# Patient Record
Sex: Female | Born: 1980 | Race: White | Hispanic: No | Marital: Married | Smoking: Never smoker
Health system: Southern US, Community
[De-identification: ages and names within clinical notes are randomized; demographics above are authoritative.]

## PROBLEM LIST (undated history)

## (undated) HISTORY — PX: CERVICAL CONE BIOPSY: SUR198

## (undated) HISTORY — PX: ANTERIOR AND POSTERIOR VAGINAL REPAIR: SUR5

---

## 2005-01-24 ENCOUNTER — Inpatient Hospital Stay: Payer: Self-pay | Admitting: Obstetrics & Gynecology

## 2007-04-07 ENCOUNTER — Ambulatory Visit: Payer: Self-pay

## 2011-12-09 ENCOUNTER — Emergency Department: Payer: Self-pay | Admitting: Unknown Physician Specialty

## 2012-06-03 ENCOUNTER — Ambulatory Visit: Payer: Self-pay

## 2018-09-29 DIAGNOSIS — L821 Other seborrheic keratosis: Secondary | ICD-10-CM | POA: Diagnosis not present

## 2018-09-29 DIAGNOSIS — Z86018 Personal history of other benign neoplasm: Secondary | ICD-10-CM | POA: Diagnosis not present

## 2018-09-29 DIAGNOSIS — L578 Other skin changes due to chronic exposure to nonionizing radiation: Secondary | ICD-10-CM | POA: Diagnosis not present

## 2018-09-29 DIAGNOSIS — D485 Neoplasm of uncertain behavior of skin: Secondary | ICD-10-CM | POA: Diagnosis not present

## 2018-10-10 ENCOUNTER — Telehealth: Payer: Self-pay | Admitting: Obstetrics & Gynecology

## 2018-10-10 NOTE — Telephone Encounter (Signed)
Patient is schedule 11/21/18 for mirena removal and insertions

## 2018-10-12 NOTE — Telephone Encounter (Signed)
Noted. Will order to arrive by apt date/time. 

## 2018-10-26 DIAGNOSIS — D2221 Melanocytic nevi of right ear and external auricular canal: Secondary | ICD-10-CM | POA: Diagnosis not present

## 2018-11-21 ENCOUNTER — Other Ambulatory Visit (HOSPITAL_COMMUNITY)
Admission: RE | Admit: 2018-11-21 | Discharge: 2018-11-21 | Disposition: A | Discharge status: Home / Self Care | Payer: BLUE CROSS/BLUE SHIELD | Source: Ambulatory Visit | Attending: Obstetrics & Gynecology | Admitting: Obstetrics & Gynecology

## 2018-11-21 ENCOUNTER — Ambulatory Visit (INDEPENDENT_AMBULATORY_CARE_PROVIDER_SITE_OTHER): Payer: BLUE CROSS/BLUE SHIELD | Admitting: Obstetrics & Gynecology

## 2018-11-21 ENCOUNTER — Encounter: Payer: Self-pay | Admitting: Obstetrics & Gynecology

## 2018-11-21 VITALS — BP 120/80 | Ht 67.0 in | Wt 157.0 lb

## 2018-11-21 DIAGNOSIS — Z01419 Encounter for gynecological examination (general) (routine) without abnormal findings: Secondary | ICD-10-CM | POA: Diagnosis not present

## 2018-11-21 DIAGNOSIS — Z30433 Encounter for removal and reinsertion of intrauterine contraceptive device: Secondary | ICD-10-CM

## 2018-11-21 DIAGNOSIS — Z124 Encounter for screening for malignant neoplasm of cervix: Secondary | ICD-10-CM | POA: Insufficient documentation

## 2018-11-21 NOTE — Progress Notes (Signed)
HPI:      [name] is a 38 y.o. [id number] who LMP was Patient's last menstrual period was 10/29/2018., she presents today for her annual examination. The patient has no complaints today; she still has exercise induced incontinence for which she uses a pessary for at times.  She has Mirena w reg cycles but improved compared to no IUD. The patient is sexually active. Her last pap: approximate date 2017 and was normal. The patient does perform self breast exams.  There is no notable family history of breast or ovarian cancer in her family.  The patient has regular exercise: yes.  The patient denies current symptoms of depression.    GYN History: Contraception: IUD  PMHx: History reviewed. No pertinent past medical history. History reviewed. No pertinent surgical history. Family History  Problem Relation Age of Onset  . Diabetes Paternal Grandmother    Social History   Tobacco Use  . Smoking status: Never Smoker  . Smokeless tobacco: Never Used  Substance Use Topics  . Alcohol use: Never    Frequency: Never  . Drug use: Never    Current Outpatient Medications:  .  levonorgestrel (MIRENA) 20 MCG/24HR IUD, 1 each by Intrauterine route once., Disp: , Rfl:  Allergies: Patient has no known allergies.  Review of Systems  Constitutional: Negative for chills, fever and malaise/fatigue.  HENT: Negative for congestion, sinus pain and sore throat.   Eyes: Negative for blurred vision and pain.  Respiratory: Negative for cough and wheezing.   Cardiovascular: Negative for chest pain and leg swelling.  Gastrointestinal: Negative for abdominal pain, constipation, diarrhea, heartburn, nausea and vomiting.  Genitourinary: Negative for dysuria, frequency, hematuria and urgency.  Musculoskeletal: Negative for back pain, joint pain, myalgias and neck pain.  Skin: Negative for itching and rash.  Neurological: Negative for dizziness, tremors and weakness.  Endo/Heme/Allergies: Does not  bruise/bleed easily.  Psychiatric/Behavioral: Negative for depression. The patient is not nervous/anxious and does not have insomnia.   All other systems reviewed and are negative.  Objective: BP 120/80   Ht 5\' 7"  (1.702 m)   Wt 157 lb (71.2 kg)   LMP 10/29/2018   BMI 24.59 kg/m   Filed Weights   11/21/18 0808  Weight: 157 lb (71.2 kg)   Body mass index is 24.59 kg/m. Physical Exam Constitutional:      General: She is not in acute distress.    Appearance: She is well-developed.  Genitourinary:     Pelvic exam was performed with patient supine.     Vagina, uterus and rectum normal.     No lesions in the vagina.     No vaginal bleeding.     No cervical motion tenderness, friability, lesion or polyp.     IUD strings visualized.     Uterus is mobile.     Uterus is not enlarged.     No uterine mass detected.    Uterus is anteverted.     No right or left adnexal mass present.     Right adnexa not tender.     Left adnexa not tender.  HENT:     Head: Normocephalic and atraumatic. No laceration.     Right Ear: Hearing normal.     Left Ear: Hearing normal.     Mouth/Throat:     Pharynx: Uvula midline.  Eyes:     Pupils: Pupils are equal, round, and reactive to light.  Neck:     Musculoskeletal: Normal range of motion and  neck supple.     Thyroid: No thyromegaly.  Cardiovascular:     Rate and Rhythm: Normal rate and regular rhythm.     Heart sounds: No murmur. No friction rub. No gallop.   Pulmonary:     Effort: Pulmonary effort is normal. No respiratory distress.     Breath sounds: Normal breath sounds. No wheezing.  Chest:     Breasts:        Right: No mass, skin change or tenderness.        Left: No mass, skin change or tenderness.  Abdominal:     General: Bowel sounds are normal. There is no distension.     Palpations: Abdomen is soft.     Tenderness: There is no abdominal tenderness. There is no rebound.  Musculoskeletal: Normal range of motion.  Neurological:      Mental Status: She is alert and oriented to person, place, and time.     Cranial Nerves: No cranial nerve deficit.  Skin:    General: Skin is warm and dry.  Psychiatric:        Judgment: Judgment normal.  Vitals signs reviewed.   Assessment:   1. Women's annual routine gynecological examination   2. Screening for cervical cancer   3. Encounter for removal and reinsertion of intrauterine contraceptive device    Screening Plan:            1.  Cervical Screening-  Pap smear done today  2. Breast screening- Exam annually and mammogram>40 planned - - - next year  3. Colonoscopy every 10 years, Hemoccult testing - after age 87  4. Labs managed by PCP  5. Counseling for contraception: IUD Encounter for removal and reinsertion of intrauterine contraceptive device  PROCEDURE NOTE: IUD Removal Strings of IUD identified and grasped.  IUD removed without problem.  Pt tolerated this well.  IUD noted to be intact.  IUD Insertion  Patient identified, informed consent performed, consent signed.   Discussed risks of irregular bleeding, cramping, infection, malpositioning or misplacement of the IUD outside the uterus which may require further procedure such as laparoscopy, risk of failure <1%. Time out was performed.  Urine pregnancy test negative.  A bimanual exam showed the uterus to be anteverted.  Speculum placed in the vagina.  Cervix visualized.  Cleaned with Betadine x 2.  Grasped anteriorly with a single tooth tenaculum.  Uterus sounded to 7 cm.   IUD placed per manufacturer's recommendations.  Strings trimmed to 3 cm. Tenaculum was removed, good hemostasis noted.  Patient tolerated procedure well.   Patient was given post-procedure instructions.  She was advised to have backup contraception for one week.  Patient was also asked to check IUD strings periodically and follow up in 4 weeks for IUD check.    F/U  Return in about 4 weeks (around 12/19/2018) for Follow up.  Annamarie Major, MD,  [facility], [facility] 11/21/2018  8:13 AM

## 2018-11-21 NOTE — Patient Instructions (Signed)
Intrauterine Device Insertion, Care After    This sheet gives you information about how to care for yourself after your procedure. Your health care provider may also give you more specific instructions. If you have problems or questions, contact your health care provider.  What can I expect after the procedure?  After the procedure, it is common to have:  · Cramps and pain in the abdomen.  · Light bleeding (spotting) or heavier bleeding that is like your menstrual period. This may last for up to a few days.  · Lower back pain.  · Dizziness.  · Headaches.  · Nausea.  Follow these instructions at home:  · Before resuming sexual activity, check to make sure that you can feel the IUD string(s). You should be able to feel the end of the string(s) below the opening of your cervix. If your IUD string is in place, you may resume sexual activity.  ? If you had a hormonal IUD inserted more than 7 days after your most recent period started, you will need to use a backup method of birth control for 7 days after IUD insertion. Ask your health care provider whether this applies to you.  · Continue to check that the IUD is still in place by feeling for the string(s) after every menstrual period, or once a month.  · Take over-the-counter and prescription medicines only as told by your health care provider.  · Do not drive or use heavy machinery while taking prescription pain medicine.  · Keep all follow-up visits as told by your health care provider. This is important.  Contact a health care provider if:  · You have bleeding that is heavier or lasts longer than a normal menstrual cycle.  · You have a fever.  · You have cramps or abdominal pain that get worse or do not get better with medicine.  · You develop abdominal pain that is new or is not in the same area of earlier cramping and pain.  · You feel lightheaded or weak.  · You have abnormal or bad-smelling discharge from your vagina.  · You have pain during sexual  activity.  · You have any of the following problems with your IUD string(s):  ? The string bothers or hurts you or your sexual partner.  ? You cannot feel the string.  ? The string has gotten longer.  · You can feel the IUD in your vagina.  · You think you may be pregnant, or you miss your menstrual period.  · You think you may have an STI (sexually transmitted infection).  Get help right away if:  · You have flu-like symptoms.  · You have a fever and chills.  · You can feel that your IUD has slipped out of place.  Summary  · After the procedure, it is common to have cramps and pain in the abdomen. It is also common to have light bleeding (spotting) or heavier bleeding that is like your menstrual period.  · Continue to check that the IUD is still in place by feeling for the string(s) after every menstrual period, or once a month.  · Keep all follow-up visits as told by your health care provider. This is important.  · Contact your health care provider if you have problems with your IUD string(s), such as the string getting longer or bothering you or your sexual partner.  This information is not intended to replace advice given to you by your health care provider. Make   sure you discuss any questions you have with your health care provider.  Document Released: 05/13/2011 Document Revised: 08/05/2016 Document Reviewed: 08/05/2016  Elsevier Interactive Patient Education © 2019 [employer]

## 2018-11-23 LAB — CYTOLOGY - PAP
Diagnosis: NEGATIVE
HPV: NOT DETECTED

## 2018-12-27 ENCOUNTER — Other Ambulatory Visit: Payer: Self-pay

## 2018-12-27 ENCOUNTER — Ambulatory Visit (INDEPENDENT_AMBULATORY_CARE_PROVIDER_SITE_OTHER): Payer: BLUE CROSS/BLUE SHIELD | Admitting: Obstetrics & Gynecology

## 2018-12-27 ENCOUNTER — Encounter: Payer: Self-pay | Admitting: Obstetrics & Gynecology

## 2018-12-27 DIAGNOSIS — Z30431 Encounter for routine checking of intrauterine contraceptive device: Secondary | ICD-10-CM | POA: Diagnosis not present

## 2018-12-27 NOTE — Progress Notes (Signed)
Virtual Visit via Telephone Note  I connected with Kayla Mitchell on 12/27/18 at  8:20 AM EDT by telephone and verified that I am speaking with the correct person using two identifiers.   I discussed the limitations, risks, security and privacy concerns of performing an evaluation and management service by telephone and the availability of in person appointments. I also discussed with the patient that there may be a patient responsible charge related to this service. The patient expressed understanding and agreed to proceed.  She was at home and I was in my office.  History of Present Illness: Pt had IUD exchange last month, reports no concerns.  Had period, monitoring its flow and amount (hopes the new IUD will have less of that).  No pain or expulsion.   Observations/Objective: No exam today, due to telephone eVisit due to [facility] virus restriction on elective visits and procedures.  Prior visits reviewed along with ultrasounds/labs as indicated.  Assessment and Plan: Menorrhagia and birth control, treated w Mirena IUD Five year treatment plan discussed Alternatives for bleeding control discussed  Follow Up Instructions: PRN and for annual next year Monitor cycles for bleeding as time goes forward   I discussed the assessment and treatment plan with the patient. The patient was provided an opportunity to ask questions and all were answered. The patient agreed with the plan and demonstrated an understanding of the instructions.   The patient was advised to call back or seek an in-person evaluation if the symptoms worsen or if the condition fails to improve as anticipated.  I provided 6 minutes of non-face-to-face time during this encounter.   Letitia Libra, MD

## 2019-01-19 ENCOUNTER — Encounter: Payer: Self-pay | Admitting: Obstetrics & Gynecology

## 2019-06-27 ENCOUNTER — Other Ambulatory Visit: Payer: Self-pay | Admitting: Obstetrics & Gynecology

## 2019-06-27 DIAGNOSIS — Z1231 Encounter for screening mammogram for malignant neoplasm of breast: Secondary | ICD-10-CM

## 2019-08-03 ENCOUNTER — Ambulatory Visit
Admission: RE | Admit: 2019-08-03 | Discharge: 2019-08-03 | Disposition: A | Discharge status: Home / Self Care | Payer: BC Managed Care – PPO | Source: Ambulatory Visit | Attending: Obstetrics & Gynecology | Admitting: Obstetrics & Gynecology

## 2019-08-03 ENCOUNTER — Encounter: Payer: Self-pay | Admitting: Radiology

## 2019-08-03 DIAGNOSIS — Z1231 Encounter for screening mammogram for malignant neoplasm of breast: Secondary | ICD-10-CM | POA: Diagnosis not present

## 2019-09-14 ENCOUNTER — Other Ambulatory Visit: Payer: BC Managed Care – PPO

## 2019-11-23 ENCOUNTER — Other Ambulatory Visit: Payer: Self-pay

## 2019-11-23 ENCOUNTER — Encounter: Payer: Self-pay | Admitting: Obstetrics & Gynecology

## 2019-11-23 ENCOUNTER — Ambulatory Visit (INDEPENDENT_AMBULATORY_CARE_PROVIDER_SITE_OTHER): Payer: BC Managed Care – PPO | Admitting: Obstetrics & Gynecology

## 2019-11-23 VITALS — BP 120/80 | Ht 67.0 in | Wt 149.0 lb

## 2019-11-23 DIAGNOSIS — Z13220 Encounter for screening for lipoid disorders: Secondary | ICD-10-CM | POA: Diagnosis not present

## 2019-11-23 DIAGNOSIS — Z975 Presence of (intrauterine) contraceptive device: Secondary | ICD-10-CM

## 2019-11-23 DIAGNOSIS — Z01419 Encounter for gynecological examination (general) (routine) without abnormal findings: Secondary | ICD-10-CM

## 2019-11-23 DIAGNOSIS — Z1321 Encounter for screening for nutritional disorder: Secondary | ICD-10-CM | POA: Diagnosis not present

## 2019-11-23 DIAGNOSIS — Z1329 Encounter for screening for other suspected endocrine disorder: Secondary | ICD-10-CM

## 2019-11-23 DIAGNOSIS — Z1231 Encounter for screening mammogram for malignant neoplasm of breast: Secondary | ICD-10-CM

## 2019-11-23 DIAGNOSIS — Z131 Encounter for screening for diabetes mellitus: Secondary | ICD-10-CM | POA: Diagnosis not present

## 2019-11-23 NOTE — Patient Instructions (Addendum)
PAP every three years Mammogram every year (due Nov)    Call [phone] to schedule at [facility] today

## 2019-11-23 NOTE — Progress Notes (Signed)
HPI:      [name] is a 39 y.o. [id number] who LMP was recent and light w Mirena IUD (year 1), she presents today for her annual examination. The patient has no complaints today. The patient is sexually active. Her last pap: approximate date 2020 and was normal and last mammogram: approximate date 07/2019 and was normal. The patient does perform self breast exams.  There is no notable family history of breast or ovarian cancer in her family.  The patient has regular exercise: yes.  The patient denies current symptoms of depression.  Pt has exercise induced incontinence for which she uses a pessary (incintinece ring, prob size 5).  GYN History: Contraception: IUD  PMHx: History reviewed. No pertinent past medical history. Past Surgical History:  Procedure Laterality Date  . ANTERIOR AND POSTERIOR VAGINAL REPAIR    . CERVICAL CONE BIOPSY     Family History  Problem Relation Age of Onset  . Diabetes Paternal Grandmother    Social History   Tobacco Use  . Smoking status: Never Smoker  . Smokeless tobacco: Never Used  Substance Use Topics  . Alcohol use: Never  . Drug use: Never    Current Outpatient Medications:  .  levonorgestrel (MIRENA) 20 MCG/24HR IUD, 1 each by Intrauterine route once., Disp: , Rfl:  Allergies: Patient has no known allergies.  Review of Systems  Constitutional: Negative for chills, fever and malaise/fatigue.  HENT: Negative for congestion, sinus pain and sore throat.   Eyes: Negative for blurred vision and pain.  Respiratory: Negative for cough and wheezing.   Cardiovascular: Negative for chest pain and leg swelling.  Gastrointestinal: Negative for abdominal pain, constipation, diarrhea, heartburn, nausea and vomiting.  Genitourinary: Negative for dysuria, frequency, hematuria and urgency.  Musculoskeletal: Negative for back pain, joint pain, myalgias and neck pain.  Skin: Negative for itching and rash.  Neurological: Negative for dizziness,  tremors and weakness.  Endo/Heme/Allergies: Does not bruise/bleed easily.  Psychiatric/Behavioral: Negative for depression. The patient is not nervous/anxious and does not have insomnia.     Objective: BP 120/80   Ht 5\' 7"  (1.702 m)   Wt 149 lb (67.6 kg)   BMI 23.34 kg/m   Filed Weights   11/23/19 0852  Weight: 149 lb (67.6 kg)   Body mass index is 23.34 kg/m. Physical Exam Constitutional:      General: She is not in acute distress.    Appearance: She is well-developed.  Genitourinary:     Pelvic exam was performed with patient supine.     Urethra, bladder, vagina, uterus and rectum normal.     No lesions in the vagina.     No vaginal bleeding.     No cervical motion tenderness, friability, lesion or polyp.     IUD strings visualized.     Uterus is mobile.     Uterus is not enlarged.     No uterine mass detected.    Uterus is retroverted.     No right or left adnexal mass present.     Right adnexa not tender.     Left adnexa not tender.  HENT:     Head: Normocephalic and atraumatic. No laceration.     Right Ear: Hearing normal.     Left Ear: Hearing normal.     Mouth/Throat:     Pharynx: Uvula midline.  Eyes:     Pupils: Pupils are equal, round, and reactive to light.  Neck:     Thyroid: No  thyromegaly.  Cardiovascular:     Rate and Rhythm: Normal rate and regular rhythm.     Heart sounds: No murmur. No friction rub. No gallop.   Pulmonary:     Effort: Pulmonary effort is normal. No respiratory distress.     Breath sounds: Normal breath sounds. No wheezing.  Chest:     Breasts:        Right: No mass, skin change or tenderness.        Left: No mass, skin change or tenderness.  Abdominal:     General: Bowel sounds are normal. There is no distension.     Palpations: Abdomen is soft.     Tenderness: There is no abdominal tenderness. There is no rebound.  Musculoskeletal:        General: Normal range of motion.     Cervical back: Normal range of motion and neck  supple.  Neurological:     Mental Status: She is alert and oriented to person, place, and time.     Cranial Nerves: No cranial nerve deficit.  Skin:    General: Skin is warm and dry.  Psychiatric:        Judgment: Judgment normal.  Vitals reviewed.     Assessment:  ANNUAL EXAM 1. Women's annual routine gynecological examination   2. Encounter for screening mammogram for malignant neoplasm of breast   3. Screening for thyroid disorder   4. Screening for diabetes mellitus   5. Screening for cholesterol level   6. Encounter for vitamin deficiency screening     Screening Plan:            1.  Cervical Screening-  Pap smear schedule reviewed with patient  2. Breast screening- Exam annually and mammogram>40 planned   3. Colonoscopy every 10 years, Hemoccult testing - after age 48  4. Labs Ordered today  5. Counseling for contraception: IUD   6. Incontinence.  Cont intermittent use of pessary as needed.  Desires new pessary.  Current one helps although not completely, still wears pad.  Will try cup pessary option as next alternative, a #5 size is ordered today and we will place and assess once it arrives.    F/U  Return in about 1 year (around 11/22/2020) for Annual.  Annamarie Major, MD, [facility], [facility] 11/23/2019  9:13 AM

## 2019-11-24 LAB — TSH: TSH: 1.7 u[IU]/mL (ref 0.450–4.500)

## 2019-11-24 LAB — LIPID PANEL
Chol/HDL Ratio: 2.7 ratio (ref 0.0–4.4)
Cholesterol, Total: 127 mg/dL (ref 100–199)
HDL: 47 mg/dL (ref >39)
LDL Chol Calc (NIH): 67 mg/dL (ref 0–99)
Triglycerides: 60 mg/dL (ref 0–149)
VLDL Cholesterol Cal: 13 mg/dL (ref 5–40)

## 2019-11-24 LAB — GLUCOSE, FASTING: Glucose, Plasma: 65 mg/dL (ref 65–99)

## 2019-11-24 LAB — VITAMIN D 25 HYDROXY (VIT D DEFICIENCY, FRACTURES): Vit D, 25-Hydroxy: 39.8 ng/mL (ref 30.0–100.0)

## 2019-12-11 NOTE — Telephone Encounter (Signed)
Do you know anything about this pessary?

## 2020-01-05 ENCOUNTER — Other Ambulatory Visit: Payer: Self-pay

## 2020-01-05 ENCOUNTER — Ambulatory Visit (INDEPENDENT_AMBULATORY_CARE_PROVIDER_SITE_OTHER): Payer: BC Managed Care – PPO | Admitting: Obstetrics & Gynecology

## 2020-01-05 ENCOUNTER — Encounter: Payer: Self-pay | Admitting: Obstetrics & Gynecology

## 2020-01-05 ENCOUNTER — Ambulatory Visit: Payer: BC Managed Care – PPO | Attending: Internal Medicine

## 2020-01-05 VITALS — BP 120/70 | Ht 67.0 in | Wt 151.0 lb

## 2020-01-05 DIAGNOSIS — N393 Stress incontinence (female) (male): Secondary | ICD-10-CM | POA: Diagnosis not present

## 2020-01-05 DIAGNOSIS — N8189 Other female genital prolapse: Secondary | ICD-10-CM | POA: Diagnosis not present

## 2020-01-05 DIAGNOSIS — Z23 Encounter for immunization: Secondary | ICD-10-CM

## 2020-01-05 NOTE — Progress Notes (Signed)
   Covid-19 Vaccination Clinic  Name:  Kayla Mitchell    MRN: [id number] DOB: 1980-11-28  01/05/2020  [name] was observed post Covid-19 immunization for 15 minutes without incident. She was provided with Vaccine Information Sheet and instruction to access the V-Safe system.   [name] was instructed to call 911 with any severe reactions post vaccine: Marland Kitchen Difficulty breathing  . Swelling of face and throat  . A fast heartbeat  . A bad rash all over body  . Dizziness and weakness   Immunizations Administered    Name Date Dose VIS Date Route   Pfizer COVID-19 Vaccine 01/05/2020  8:22 AM 0.3 mL 09/08/2019 Intramuscular   Manufacturer: [employer], [employer]   Lot: [id number]   NDC: [address]-8

## 2020-01-05 NOTE — Progress Notes (Signed)
  HPI:      [name] is a 39 y.o. [id number] who presents today for her pessary placement and examination related to her pelvic floor weakening and mostly for her stress urinary incontinence w exercise.  Pt reports tolerating the pessary in past well, and is here for change in pessary to the cup type (#5).  She had some discomforts w the prior incontinence ring.  She plans to place and remove pessary herself when she plays sports or exercises.  PMHx: She  has no past medical history on file. Also,  has a past surgical history that includes Anterior and posterior vaginal repair and Cervical cone biopsy., family history includes Diabetes in her paternal grandmother.,  reports that she has never smoked. She has never used smokeless tobacco. She reports that she does not drink alcohol or use drugs.  She has a current medication list which includes the following prescription(s): levonorgestrel. Also, has No Known Allergies.  Review of Systems  All other systems reviewed and are negative.   Objective: BP 120/70   Ht 5\' 7"  (1.702 m)   Wt 151 lb (68.5 kg)   LMP 01/04/2020   BMI 23.65 kg/m  Physical Exam Constitutional:      General: She is not in acute distress.    Appearance: She is well-developed.  Genitourinary:     Pelvic exam was performed with patient supine.     Vagina and uterus normal.     No vaginal erythema or bleeding.     No cervical motion tenderness, discharge, polyp or nabothian cyst.     Uterus is mobile.     Uterus is not enlarged.     No uterine mass detected.    Uterus is midaxial.     No right or left adnexal mass present.     Right adnexa not tender.     Left adnexa not tender.  HENT:     Head: Normocephalic and atraumatic.     Nose: Nose normal.  Abdominal:     General: There is no distension.     Palpations: Abdomen is soft.     Tenderness: There is no abdominal tenderness.  Musculoskeletal:        General: Normal range of motion.  Neurological:       Mental Status: She is alert and oriented to person, place, and time.     Cranial Nerves: No cranial nerve deficit.  Skin:    General: Skin is warm and dry.  Psychiatric:        Attention and Perception: Attention normal.        Mood and Affect: Mood and affect normal.        Speech: Speech normal.        Behavior: Behavior normal.        Thought Content: Thought content normal.        Judgment: Judgment normal.     A/P:   ICD-10-CM   1. Stress incontinence in female  N39.3   Pessary was placed today and appears to have proper fit and tolerability. Instructions given for care. Concerning symptoms to observe for are counseled to patient. Follow up yearly  A total of 20 minutes were spent face-to-face with the patient as well as preparation, review, communication, and documentation during this encounter.   03/05/2020, MD, [facility], [facility] 01/05/2020  10:45 AM

## 2020-01-31 ENCOUNTER — Ambulatory Visit: Payer: BC Managed Care – PPO | Attending: Internal Medicine

## 2020-01-31 DIAGNOSIS — Z23 Encounter for immunization: Secondary | ICD-10-CM

## 2020-01-31 NOTE — Progress Notes (Signed)
   Covid-19 Vaccination Clinic  Name:  COZETTA SEIF    MRN: [id number] DOB: 08/27/1981  01/31/2020  Ms. Mcnerney was observed post Covid-19 immunization for 15 minutes without incident. She was provided with Vaccine Information Sheet and instruction to access the V-Safe system.   Ms. Wirsing was instructed to call 911 with any severe reactions post vaccine: Marland Kitchen Difficulty breathing  . Swelling of face and throat  . A fast heartbeat  . A bad rash all over body  . Dizziness and weakness   Immunizations Administered    Name Date Dose VIS Date Route   Pfizer COVID-19 Vaccine 01/31/2020  8:30 AM 0.3 mL 11/22/2018 Intramuscular   Manufacturer: [employer], [employer]   Lot: [id number]   NDC: [address]-7

## 2020-08-06 ENCOUNTER — Emergency Department: Payer: BC Managed Care – PPO

## 2020-08-06 ENCOUNTER — Other Ambulatory Visit: Payer: Self-pay

## 2020-08-06 ENCOUNTER — Emergency Department
Admission: EM | Admit: 2020-08-06 | Discharge: 2020-08-06 | Disposition: A | Discharge status: Home / Self Care | Payer: BC Managed Care – PPO | Attending: Emergency Medicine | Admitting: Emergency Medicine

## 2020-08-06 DIAGNOSIS — S199XXA Unspecified injury of neck, initial encounter: Secondary | ICD-10-CM | POA: Diagnosis not present

## 2020-08-06 DIAGNOSIS — S39012A Strain of muscle, fascia and tendon of lower back, initial encounter: Secondary | ICD-10-CM | POA: Insufficient documentation

## 2020-08-06 DIAGNOSIS — Y92410 Unspecified street and highway as the place of occurrence of the external cause: Secondary | ICD-10-CM | POA: Diagnosis not present

## 2020-08-06 DIAGNOSIS — S161XXA Strain of muscle, fascia and tendon at neck level, initial encounter: Secondary | ICD-10-CM | POA: Diagnosis not present

## 2020-08-06 DIAGNOSIS — Z041 Encounter for examination and observation following transport accident: Secondary | ICD-10-CM | POA: Diagnosis not present

## 2020-08-06 LAB — POC URINE PREG, ED: Preg Test, Ur: NEGATIVE

## 2020-08-06 MED ORDER — IBUPROFEN 600 MG PO TABS
600.0000 mg | ORAL_TABLET | Freq: Once | ORAL | Status: AC
  Administered 2020-08-06: 600 mg via ORAL
  Filled 2020-08-06: qty 1

## 2020-08-06 MED ORDER — CYCLOBENZAPRINE HCL 10 MG PO TABS
10.0000 mg | ORAL_TABLET | Freq: Once | ORAL | Status: AC
  Administered 2020-08-06: 10 mg via ORAL
  Filled 2020-08-06: qty 1

## 2020-08-06 MED ORDER — NAPROXEN 500 MG PO TABS: 500.0000 mg | ORAL_TABLET | Freq: Two times a day (BID) | ORAL | Status: AC

## 2020-08-06 MED ORDER — TRAMADOL HCL 50 MG PO TABS
50.0000 mg | ORAL_TABLET | Freq: Once | ORAL | Status: AC
  Administered 2020-08-06: 50 mg via ORAL
  Filled 2020-08-06: qty 1

## 2020-08-06 MED ORDER — ORPHENADRINE CITRATE ER 100 MG PO TB12: 100.0000 mg | ORAL_TABLET | Freq: Two times a day (BID) | ORAL | 0 refills | Status: AC
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
## 2020-08-06 NOTE — Discharge Instructions (Signed)
No acute findings on x-ray of the cervical lumbar spine.  Follow discharge care instruction take medication as directed. 

## 2020-08-14 ENCOUNTER — Other Ambulatory Visit: Payer: Self-pay

## 2020-08-14 ENCOUNTER — Ambulatory Visit
Admission: RE | Admit: 2020-08-14 | Discharge: 2020-08-14 | Disposition: A | Discharge status: Home / Self Care | Payer: BC Managed Care – PPO | Source: Ambulatory Visit | Attending: Obstetrics & Gynecology | Admitting: Obstetrics & Gynecology

## 2020-08-14 DIAGNOSIS — Z1231 Encounter for screening mammogram for malignant neoplasm of breast: Secondary | ICD-10-CM

## 2020-09-23 ENCOUNTER — Ambulatory Visit: Payer: No Typology Code available for payment source | Attending: Internal Medicine

## 2020-09-23 DIAGNOSIS — Z23 Encounter for immunization: Secondary | ICD-10-CM

## 2020-09-23 NOTE — Progress Notes (Signed)
   Covid-19 Vaccination Clinic  Name:  TEMPRENCE RHINES    MRN: [id number] DOB: 1981/06/13  09/23/2020  Ms. Stober was observed post Covid-19 immunization for 15 minutes without incident. She was provided with Vaccine Information Sheet and instruction to access the V-Safe system.   Ms. Ruggirello was instructed to call 911 with any severe reactions post vaccine: Marland Kitchen Difficulty breathing  . Swelling of face and throat  . A fast heartbeat  . A bad rash all over body  . Dizziness and weakness   Immunizations Administered    Name Date Dose VIS Date Route   Pfizer COVID-19 Vaccine 09/23/2020  1:17 PM 0.3 mL 07/17/2020 Intramuscular   Manufacturer: [employer], [employer]   Lot: [id number]   NDC: [address]-8

## 2020-10-27 ENCOUNTER — Emergency Department
Admission: EM | Admit: 2020-10-27 | Discharge: 2020-10-27 | Disposition: A | Discharge status: Home / Self Care | Payer: BC Managed Care – PPO | Attending: Emergency Medicine | Admitting: Emergency Medicine

## 2020-10-27 ENCOUNTER — Encounter: Payer: Self-pay | Admitting: Emergency Medicine

## 2020-10-27 ENCOUNTER — Other Ambulatory Visit: Payer: Self-pay

## 2020-10-27 ENCOUNTER — Emergency Department: Payer: BC Managed Care – PPO

## 2020-10-27 DIAGNOSIS — Z23 Encounter for immunization: Secondary | ICD-10-CM | POA: Diagnosis not present

## 2020-10-27 DIAGNOSIS — S4991XA Unspecified injury of right shoulder and upper arm, initial encounter: Secondary | ICD-10-CM | POA: Diagnosis not present

## 2020-10-27 DIAGNOSIS — W298XXA Contact with other powered powered hand tools and household machinery, initial encounter: Secondary | ICD-10-CM | POA: Insufficient documentation

## 2020-10-27 DIAGNOSIS — S41111A Laceration without foreign body of right upper arm, initial encounter: Secondary | ICD-10-CM

## 2020-10-27 DIAGNOSIS — M795 Residual foreign body in soft tissue: Secondary | ICD-10-CM

## 2020-10-27 DIAGNOSIS — S41011A Laceration without foreign body of right shoulder, initial encounter: Secondary | ICD-10-CM | POA: Diagnosis not present

## 2020-10-27 MED ORDER — TETANUS-DIPHTH-ACELL PERTUSSIS 5-2.5-18.5 LF-MCG/0.5 IM SUSY
0.5000 mL | PREFILLED_SYRINGE | Freq: Once | INTRAMUSCULAR | Status: AC
  Administered 2020-10-27: 0.5 mL via INTRAMUSCULAR
  Filled 2020-10-27: qty 0.5

## 2020-10-27 MED ORDER — CEPHALEXIN 500 MG PO CAPS: 500.0000 mg | ORAL_CAPSULE | Freq: Four times a day (QID) | ORAL | 0 refills | Status: AC

## 2020-10-27 MED ORDER — CEPHALEXIN 500 MG PO CAPS
1000.0000 mg | ORAL_CAPSULE | Freq: Once | ORAL | Status: AC
  Administered 2020-10-27: 1000 mg via ORAL
  Filled 2020-10-27: qty 2

## 2020-10-27 MED ORDER — FLUCONAZOLE 150 MG PO TABS: ORAL_TABLET | ORAL | 0 refills | Status: AC

## 2020-10-27 MED ORDER — LIDOCAINE-EPINEPHRINE 2 %-1:100000 IJ SOLN
20.0000 mL | Freq: Once | INTRAMUSCULAR | Status: DC
  Filled 2020-10-27: qty 1

## 2020-10-27 NOTE — Discharge Instructions (Signed)
Please take antibiotic as prescribed. Visit primary care, urgent care, or return to the ER for suture removal in 7-10 days.
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
## 2020-10-27 NOTE — ED Provider Notes (Signed)
[facility] Emergency Department Provider Note  ____________________________________________   Event Date/Time   First MD Initiated Contact with Patient 10/27/20 1800     (approximate)  I have reviewed the triage vital signs and the nursing notes.   HISTORY  Chief Complaint Laceration   HPI Kayla Mitchell is a 40 y.o. female who reports to the emergency department for evaluation of laceration to the inner right upper arm.  Patient states that she was using a drill to hang some shelves when her right arm became tired and she decided to use her left arm above head.  The drill slipped out of her hand with the drill bit coming down on the inner aspect of her right arm, approximately 1 inch proximal to the medial elbow.  She states that there was initially a lot of bleeding, controlled with a towel.  She presented immediately to the emergency department.  Tetanus is greater than 65 years old.  She denies any weakness or numbness of the distal or proximal right arm.         History reviewed. No pertinent past medical history.  There are no problems to display for this patient.   Past Surgical History:  Procedure Laterality Date  . ANTERIOR AND POSTERIOR VAGINAL REPAIR    . CERVICAL CONE BIOPSY      Prior to Admission medications   Medication Sig Start Date End Date Taking? Authorizing Provider  cephALEXin (KEFLEX) 500 MG capsule Take 1 capsule (500 mg total) by mouth 4 (four) times daily for 7 days. 10/27/20 11/03/20 Yes Lucy Chris, PA  fluconazole (DIFLUCAN) 150 MG tablet Take one tablet 24 hours after beginning antibiotic. May repeat dose at end of antibiotic if yeast infection persists. 10/27/20  Yes Lucy Chris, PA  levonorgestrel (MIRENA) 20 MCG/24HR IUD 1 each by Intrauterine route once.    [provider]  naproxen (NAPROSYN) 500 MG tablet Take 1 tablet (500 mg total) by mouth 2 (two) times daily with a meal. 08/06/20   Joni Reining, PA-C  orphenadrine (NORFLEX) 100 MG tablet Take 1 tablet (100 mg total) by mouth 2 (two) times daily. 08/06/20   Joni Reining, PA-C    Allergies Patient has no known allergies.  Family History  Problem Relation Age of Onset  . Diabetes Paternal Grandmother     Social History Social History   Tobacco Use  . Smoking status: Never Smoker  . Smokeless tobacco: Never Used  Vaping Use  . Vaping Use: Never used  Substance Use Topics  . Alcohol use: Never  . Drug use: Never    Review of Systems Constitutional: No fever/chills Eyes: No visual changes. ENT: No sore throat. Cardiovascular: Denies chest pain. Respiratory: Denies shortness of breath. Gastrointestinal: No abdominal pain.  No nausea, no vomiting.  No diarrhea.  No constipation. Genitourinary: Negative for dysuria. Musculoskeletal: Negative for back pain. Skin: Laceration to right arm Neurological: Negative for headaches, focal weakness or numbness.  ____________________________________________   PHYSICAL EXAM:  VITAL SIGNS: ED Triage Vitals  Enc Vitals Group     BP 10/27/20 1602 121/65     Pulse Rate 10/27/20 1602 86     Resp 10/27/20 1602 20     Temp 10/27/20 1602 98.2 F (36.8 C)     Temp Source 10/27/20 1602 Oral     SpO2 10/27/20 1602 100 %     Weight 10/27/20 1603 152 lb (68.9 kg)     Height 10/27/20  1603 5\' 7"  (1.702 m)     Head Circumference --      Peak Flow --      Pain Score 10/27/20 1602 2     Pain Loc --      Pain Edu? --      Excl. in GC? --     Constitutional: Alert and oriented. Well appearing and in no acute distress. Eyes: Conjunctivae are normal. PERRL. EOMI. Head: Atraumatic. Neck: No stridor.   Cardiovascular: Normal rate, regular rhythm. Grossly normal heart sounds.  Good peripheral circulation. Respiratory: Normal respiratory effort.  No retractions. Lungs CTAB. Gastrointestinal: Soft and nontender. No distention. No abdominal bruits. No CVA  tenderness. Musculoskeletal: Full active range of motion of the right digits, wrist and elbow.  5/5 strength of elbow flexion and wrist flexion.  Radial pulse 2+.  Sensation intact. Neurologic:  Normal speech and language. No gross focal neurologic deficits are appreciated. No gait instability. Skin: There is a V-shaped injury to the medial aspect of the right upper arm, approximately 1 inch proximal to the elbow.  The majority of this appears to be superficial scratch except for an approximately 3 cm laceration at the apex of the V.  There is surrounding ecchymosis with mild soft tissue swelling.  Wound has a large amount of fatty tissue exposed, though appears less than 0.5 cm deep. Psychiatric: Mood and affect are normal. Speech and behavior are normal.   ____________________________________________  RADIOLOGY I, 10/29/20, personally viewed and evaluated these images (plain radiographs) as part of my medical decision making, as well as reviewing the written report by the radiologist.  ED provider interpretation: No obvious foreign body  Official radiology report(s): DG ELBOW COMPLETE RIGHT (3+VIEW)  Result Date: 10/27/2020 CLINICAL DATA:  Foreign body in soft tissue. EXAM: RIGHT ELBOW - COMPLETE 3+ VIEW COMPARISON:  None. FINDINGS: There is no evidence of fracture, dislocation, or joint effusion. There is no evidence of arthropathy or other focal bone abnormality. No radiopaque foreign body. Dressing overlies the medial antecubital fossa left mild subjacent skin irregularity. IMPRESSION: Skin irregularity with overlying dressing in place. No radiopaque foreign body or acute osseous abnormality. Electronically Signed   By: 10/29/2020 M.D.   On: 10/27/2020 19:00    ____________________________________________   PROCEDURES  Procedure(s) performed (including Critical Care):  02/01/2022Marland KitchenLaceration Repair  Date/Time: 10/27/2020 9:45 PM Performed by: 10/29/2020, PA Authorized  by: Lucy Chris, PA   Consent:    Consent obtained:  Verbal   Consent given by:  Patient   Risks, benefits, and alternatives were discussed: yes     Risks discussed:  Infection, pain and poor cosmetic result   Alternatives discussed:  No treatment, delayed treatment and referral Universal protocol:    Procedure explained and questions answered to patient or proxy's satisfaction: yes     Patient identity confirmed:  Verbally with patient Anesthesia:    Anesthesia method:  Local infiltration   Local anesthetic:  Lidocaine 2% WITH epi Laceration details:    Location:  Shoulder/arm   Shoulder/arm location:  R upper arm   Length (cm):  3   Depth (mm):  5 Pre-procedure details:    Preparation:  Patient was prepped and draped in usual sterile fashion and imaging obtained to evaluate for foreign bodies Exploration:    Hemostasis achieved with:  Epinephrine   Imaging outcome: foreign body not noted     Wound exploration: wound explored through full range of motion   Treatment:  Area cleansed with:  Povidone-iodine and saline   Amount of cleaning:  Standard   Irrigation solution:  Sterile saline   Irrigation method:  Syringe and tap   Debridement:  None Skin repair:    Repair method:  Sutures   Suture size:  5-0   Suture material:  Nylon   Suture technique:  Simple interrupted   Number of sutures:  6 Approximation:    Approximation:  Close Repair type:    Repair type:  Simple Post-procedure details:    Dressing:  Non-adherent dressing   Procedure completion:  Tolerated well, no immediate complications     ____________________________________________   INITIAL IMPRESSION / ASSESSMENT AND PLAN / ED COURSE  As part of my medical decision making, I reviewed the following data within the electronic MEDICAL RECORD NUMBER Nursing notes reviewed and incorporated and Radiograph reviewed        Patient is a 40 year old female who presents to the emergency department for  evaluation of laceration to the inner aspect of the right upper arm.  See HPI for further details.  On physical exam, there is a 3 cm laceration with bleeding controlled of the medial aspect of the right upper arm, just 1 inch proximal to the elbow.  Patient is neurovascularly intact and maintains full active range of motion of the digits, wrist and elbow.  Imaging was obtained to rule out foreign body and there is none that is obviously present.  Laceration was repaired, refer to procedure note.  Patient will be placed on Keflex prophylactically.  Tdap was updated.  Patient noted that she usually gets yeast infections with use of antibiotics and Diflucan was sent to her pharmacy.  Patient was educated on wound care of sutured lacerations.  She will seek repeat medical evaluation in 7 to 10 days for likely suture removal.  Patient is amenable with this plan, she will return for any acute worsening.      ____________________________________________   FINAL CLINICAL IMPRESSION(S) / ED DIAGNOSES  Final diagnoses:  Laceration of right upper extremity, initial encounter     ED Discharge Orders         Ordered    cephALEXin (KEFLEX) 500 MG capsule  4 times daily        10/27/20 1936    fluconazole (DIFLUCAN) 150 MG tablet        10/27/20 1936          *Please note:  Kayla Mitchell was evaluated in Emergency Department on 10/27/2020 for the symptoms described in the history of present illness. She was evaluated in the context of the global COVID-19 pandemic, which necessitated consideration that the patient might be at risk for infection with the SARS-CoV-2 virus that causes COVID-19. Institutional protocols and algorithms that pertain to the evaluation of patients at risk for COVID-19 are in a state of rapid change based on information released by regulatory bodies including the CDC and federal and state organizations. These policies and algorithms were followed during the patient's care in the  ED.  Some ED evaluations and interventions may be delayed as a result of limited staffing during and the pandemic.*   Note:  This document was prepared using Dragon voice recognition software and may include unintentional dictation errors.   Lucy Chris, PA 10/27/20 2148    Shaune Pollack, MD 10/30/20 [phone]

## 2020-10-29 DIAGNOSIS — D225 Melanocytic nevi of trunk: Secondary | ICD-10-CM | POA: Diagnosis not present

## 2020-10-29 DIAGNOSIS — D2261 Melanocytic nevi of right upper limb, including shoulder: Secondary | ICD-10-CM | POA: Diagnosis not present

## 2020-10-29 DIAGNOSIS — D2262 Melanocytic nevi of left upper limb, including shoulder: Secondary | ICD-10-CM | POA: Diagnosis not present

## 2020-10-29 DIAGNOSIS — L578 Other skin changes due to chronic exposure to nonionizing radiation: Secondary | ICD-10-CM | POA: Diagnosis not present

## 2020-11-25 ENCOUNTER — Encounter: Payer: Self-pay | Admitting: Obstetrics & Gynecology

## 2020-11-25 ENCOUNTER — Other Ambulatory Visit: Payer: Self-pay

## 2020-11-25 ENCOUNTER — Ambulatory Visit (INDEPENDENT_AMBULATORY_CARE_PROVIDER_SITE_OTHER): Payer: BC Managed Care – PPO | Admitting: Obstetrics & Gynecology

## 2020-11-25 VITALS — BP 100/60 | Ht 67.0 in | Wt 159.0 lb

## 2020-11-25 DIAGNOSIS — Z01419 Encounter for gynecological examination (general) (routine) without abnormal findings: Secondary | ICD-10-CM

## 2020-11-25 NOTE — Patient Instructions (Signed)
Thank you for choosing [facility]. As part of our ongoing efforts to improve patient experience, we would appreciate your feedback. Please fill out the short survey that you will receive by mail or MyChart. Your opinion is important to us! -Dr Abdulraheem Pineo  

## 2020-11-25 NOTE — Progress Notes (Signed)
HPI:      [name] is a 40 y.o. [id number] who LMP was Patient's last menstrual period was 11/20/2020., she presents today for her annual examination. The patient has no complaints today. The patient is sexually active. Her last pap: approximate date 2020 and was normal and last mammogram: approximate date 08/2020 and was normal. The patient does perform self breast exams.  There is no notable family history of breast or ovarian cancer in her family.  The patient has regular exercise: yes.  The patient denies current symptoms of depression.  Pessary use for exercise induced incontinence (periodic use, cup #5), has been working very well.  GYN History: Contraception: IUD Mirena year 2 (doe period control)  PMHx: History reviewed. No pertinent past medical history. Past Surgical History:  Procedure Laterality Date  . ANTERIOR AND POSTERIOR VAGINAL REPAIR    . CERVICAL CONE BIOPSY     Family History  Problem Relation Age of Onset  . Diabetes Paternal Grandmother    Social History   Tobacco Use  . Smoking status: Never Smoker  . Smokeless tobacco: Never Used  Vaping Use  . Vaping Use: Never used  Substance Use Topics  . Alcohol use: Never  . Drug use: Never    Current Outpatient Medications:  .  levonorgestrel (MIRENA) 20 MCG/24HR IUD, 1 each by Intrauterine route once., Disp: , Rfl:  .  fluconazole (DIFLUCAN) 150 MG tablet, Take one tablet 24 hours after beginning antibiotic. May repeat dose at end of antibiotic if yeast infection persists. (Patient not taking: Reported on 11/25/2020), Disp: 2 tablet, Rfl: 0 .  naproxen (NAPROSYN) 500 MG tablet, Take 1 tablet (500 mg total) by mouth 2 (two) times daily with a meal. (Patient not taking: Reported on 11/25/2020), Disp: 20 tablet, Rfl: 00 .  orphenadrine (NORFLEX) 100 MG tablet, Take 1 tablet (100 mg total) by mouth 2 (two) times daily. (Patient not taking: Reported on 11/25/2020), Disp: 10 tablet, Rfl: 0 Allergies: Patient has  no known allergies.  Review of Systems  Constitutional: Negative for chills, fever and malaise/fatigue.  HENT: Negative for congestion, sinus pain and sore throat.   Eyes: Negative for blurred vision and pain.  Respiratory: Negative for cough and wheezing.   Cardiovascular: Negative for chest pain and leg swelling.  Gastrointestinal: Negative for abdominal pain, constipation, diarrhea, heartburn, nausea and vomiting.  Genitourinary: Negative for dysuria, frequency, hematuria and urgency.  Musculoskeletal: Negative for back pain, joint pain, myalgias and neck pain.  Skin: Negative for itching and rash.  Neurological: Negative for dizziness, tremors and weakness.  Endo/Heme/Allergies: Does not bruise/bleed easily.  Psychiatric/Behavioral: Negative for depression. The patient is not nervous/anxious and does not have insomnia.     Objective: BP 100/60   Ht 5\' 7"  (1.702 m)   Wt 159 lb (72.1 kg)   LMP 11/20/2020   BMI 24.90 kg/m   Filed Weights   11/25/20 0803  Weight: 159 lb (72.1 kg)   Body mass index is 24.9 kg/m. Physical Exam Constitutional:      General: She is not in acute distress.    Appearance: She is well-developed and well-nourished.  Genitourinary:     Vagina, uterus and rectum normal.     There is no rash or lesion on the right labia.     There is no rash or lesion on the left labia.    No lesions in the vagina.     No vaginal bleeding.      Right  Adnexa: not tender and no mass present.    Left Adnexa: not tender and no mass present.    No cervical motion tenderness, friability, lesion or polyp.     IUD strings visualized.     Uterus is mobile.     Uterus is not enlarged.     No uterine mass detected.    Uterus is midaxial.     Pelvic exam was performed with patient in the lithotomy position.  Breasts:     Right: No mass, skin change or tenderness.     Left: No mass, skin change or tenderness.    HENT:     Head: Normocephalic and atraumatic. No  laceration.     Right Ear: Hearing normal.     Left Ear: Hearing normal.     Nose: No epistaxis or foreign body.     Mouth/Throat:     Mouth: Oropharynx is clear and moist and mucous membranes are normal.     Pharynx: Uvula midline.  Eyes:     Pupils: Pupils are equal, round, and reactive to light.  Neck:     Thyroid: No thyromegaly.  Cardiovascular:     Rate and Rhythm: Normal rate and regular rhythm.     Heart sounds: No murmur heard. No friction rub. No gallop.   Pulmonary:     Effort: Pulmonary effort is normal. No respiratory distress.     Breath sounds: Normal breath sounds. No wheezing.  Abdominal:     General: Bowel sounds are normal. There is no distension.     Palpations: Abdomen is soft.     Tenderness: There is no abdominal tenderness. There is no rebound.  Musculoskeletal:        General: Normal range of motion.     Cervical back: Normal range of motion and neck supple.  Neurological:     Mental Status: She is alert and oriented to person, place, and time.     Cranial Nerves: No cranial nerve deficit.  Skin:    General: Skin is warm and dry.  Psychiatric:        Mood and Affect: Mood and affect normal.        Judgment: Judgment normal.  Vitals reviewed.     Assessment:  ANNUAL EXAM 1. Women's annual routine gynecological examination      Screening Plan:            1.  Cervical Screening-  Pap smear schedule reviewed with patient  2. Breast screening- Exam annually and mammogram>40 planned   3. Labs managed by PCP  4. Counseling for contraception: IUD  [facility] - 11/25/20 0805      Pregnancy Intention Screening   Does the patient want to become pregnant in the next year? No    Does the patient's partner want to become pregnant in the next year? No    Would the patient like to discuss contraceptive options today? No      Contraception Wrap Up   Current Method IUD or IUS    Contraception Counseling Provided Yes  ALSO discussed contraception  options for daughter (age 66, will sch appointment herself soon)         F/U  Return in about 1 year (around 11/25/2021) for Annual.  Annamarie Major, MD, [facility], [facility] 11/25/2020  8:31 AM

## 2021-02-16 DIAGNOSIS — S8392XA Sprain of unspecified site of left knee, initial encounter: Secondary | ICD-10-CM | POA: Diagnosis not present

## 2021-02-20 DIAGNOSIS — S8392XA Sprain of unspecified site of left knee, initial encounter: Secondary | ICD-10-CM | POA: Diagnosis not present

## 2021-02-20 DIAGNOSIS — S83512A Sprain of anterior cruciate ligament of left knee, initial encounter: Secondary | ICD-10-CM | POA: Diagnosis not present

## 2021-02-26 DIAGNOSIS — S83512A Sprain of anterior cruciate ligament of left knee, initial encounter: Secondary | ICD-10-CM | POA: Diagnosis not present

## 2021-03-03 DIAGNOSIS — S83512A Sprain of anterior cruciate ligament of left knee, initial encounter: Secondary | ICD-10-CM | POA: Diagnosis not present

## 2021-03-07 DIAGNOSIS — M25662 Stiffness of left knee, not elsewhere classified: Secondary | ICD-10-CM | POA: Diagnosis not present

## 2021-03-07 DIAGNOSIS — M25562 Pain in left knee: Secondary | ICD-10-CM | POA: Diagnosis not present

## 2021-03-07 DIAGNOSIS — S83512D Sprain of anterior cruciate ligament of left knee, subsequent encounter: Secondary | ICD-10-CM | POA: Diagnosis not present

## 2021-03-24 DIAGNOSIS — M25662 Stiffness of left knee, not elsewhere classified: Secondary | ICD-10-CM | POA: Diagnosis not present

## 2021-03-24 DIAGNOSIS — S83512D Sprain of anterior cruciate ligament of left knee, subsequent encounter: Secondary | ICD-10-CM | POA: Diagnosis not present

## 2021-03-24 DIAGNOSIS — M25562 Pain in left knee: Secondary | ICD-10-CM | POA: Diagnosis not present

## 2021-04-01 DIAGNOSIS — M25562 Pain in left knee: Secondary | ICD-10-CM | POA: Diagnosis not present

## 2021-04-01 DIAGNOSIS — M25662 Stiffness of left knee, not elsewhere classified: Secondary | ICD-10-CM | POA: Diagnosis not present

## 2021-04-01 DIAGNOSIS — S83512D Sprain of anterior cruciate ligament of left knee, subsequent encounter: Secondary | ICD-10-CM | POA: Diagnosis not present

## 2021-04-18 DIAGNOSIS — Y9364 Activity, baseball: Secondary | ICD-10-CM | POA: Diagnosis not present

## 2021-04-18 DIAGNOSIS — S83242A Other tear of medial meniscus, current injury, left knee, initial encounter: Secondary | ICD-10-CM | POA: Diagnosis not present

## 2021-04-18 DIAGNOSIS — S83282A Other tear of lateral meniscus, current injury, left knee, initial encounter: Secondary | ICD-10-CM | POA: Diagnosis not present

## 2021-04-18 DIAGNOSIS — S83232A Complex tear of medial meniscus, current injury, left knee, initial encounter: Secondary | ICD-10-CM | POA: Diagnosis not present

## 2021-04-18 DIAGNOSIS — S82852A Displaced trimalleolar fracture of left lower leg, initial encounter for closed fracture: Secondary | ICD-10-CM | POA: Diagnosis not present

## 2021-04-18 DIAGNOSIS — M25562 Pain in left knee: Secondary | ICD-10-CM | POA: Diagnosis not present

## 2021-04-18 DIAGNOSIS — M238X2 Other internal derangements of left knee: Secondary | ICD-10-CM | POA: Diagnosis not present

## 2021-04-18 DIAGNOSIS — G8918 Other acute postprocedural pain: Secondary | ICD-10-CM | POA: Diagnosis not present

## 2021-04-18 DIAGNOSIS — S83512A Sprain of anterior cruciate ligament of left knee, initial encounter: Secondary | ICD-10-CM | POA: Diagnosis not present

## 2021-04-18 DIAGNOSIS — W010XXA Fall on same level from slipping, tripping and stumbling without subsequent striking against object, initial encounter: Secondary | ICD-10-CM | POA: Diagnosis not present

## 2021-04-21 DIAGNOSIS — M25562 Pain in left knee: Secondary | ICD-10-CM | POA: Diagnosis not present

## 2021-04-21 DIAGNOSIS — M25662 Stiffness of left knee, not elsewhere classified: Secondary | ICD-10-CM | POA: Diagnosis not present

## 2021-04-24 DIAGNOSIS — M25662 Stiffness of left knee, not elsewhere classified: Secondary | ICD-10-CM | POA: Diagnosis not present

## 2021-04-24 DIAGNOSIS — M25562 Pain in left knee: Secondary | ICD-10-CM | POA: Diagnosis not present

## 2021-04-29 DIAGNOSIS — M25662 Stiffness of left knee, not elsewhere classified: Secondary | ICD-10-CM | POA: Diagnosis not present

## 2021-04-29 DIAGNOSIS — M25562 Pain in left knee: Secondary | ICD-10-CM | POA: Diagnosis not present

## 2021-04-29 DIAGNOSIS — F331 Major depressive disorder, recurrent, moderate: Secondary | ICD-10-CM | POA: Diagnosis not present

## 2021-04-30 DIAGNOSIS — M25662 Stiffness of left knee, not elsewhere classified: Secondary | ICD-10-CM | POA: Diagnosis not present

## 2021-05-01 DIAGNOSIS — M25562 Pain in left knee: Secondary | ICD-10-CM | POA: Diagnosis not present

## 2021-05-01 DIAGNOSIS — M25662 Stiffness of left knee, not elsewhere classified: Secondary | ICD-10-CM | POA: Diagnosis not present

## 2021-05-07 DIAGNOSIS — M25562 Pain in left knee: Secondary | ICD-10-CM | POA: Diagnosis not present

## 2021-05-07 DIAGNOSIS — M25662 Stiffness of left knee, not elsewhere classified: Secondary | ICD-10-CM | POA: Diagnosis not present

## 2021-05-07 DIAGNOSIS — F331 Major depressive disorder, recurrent, moderate: Secondary | ICD-10-CM | POA: Diagnosis not present

## 2021-05-09 DIAGNOSIS — M25562 Pain in left knee: Secondary | ICD-10-CM | POA: Diagnosis not present

## 2021-05-09 DIAGNOSIS — M25662 Stiffness of left knee, not elsewhere classified: Secondary | ICD-10-CM | POA: Diagnosis not present

## 2021-05-12 DIAGNOSIS — M25662 Stiffness of left knee, not elsewhere classified: Secondary | ICD-10-CM | POA: Diagnosis not present

## 2021-05-12 DIAGNOSIS — M25562 Pain in left knee: Secondary | ICD-10-CM | POA: Diagnosis not present

## 2021-05-14 DIAGNOSIS — F331 Major depressive disorder, recurrent, moderate: Secondary | ICD-10-CM | POA: Diagnosis not present

## 2021-05-15 DIAGNOSIS — M25562 Pain in left knee: Secondary | ICD-10-CM | POA: Diagnosis not present

## 2021-05-15 DIAGNOSIS — M25662 Stiffness of left knee, not elsewhere classified: Secondary | ICD-10-CM | POA: Diagnosis not present

## 2021-05-19 DIAGNOSIS — M25562 Pain in left knee: Secondary | ICD-10-CM | POA: Diagnosis not present

## 2021-05-19 DIAGNOSIS — M25662 Stiffness of left knee, not elsewhere classified: Secondary | ICD-10-CM | POA: Diagnosis not present

## 2021-05-21 DIAGNOSIS — F331 Major depressive disorder, recurrent, moderate: Secondary | ICD-10-CM | POA: Diagnosis not present

## 2021-05-22 DIAGNOSIS — M25662 Stiffness of left knee, not elsewhere classified: Secondary | ICD-10-CM | POA: Diagnosis not present

## 2021-05-22 DIAGNOSIS — M25562 Pain in left knee: Secondary | ICD-10-CM | POA: Diagnosis not present

## 2021-05-26 DIAGNOSIS — M25662 Stiffness of left knee, not elsewhere classified: Secondary | ICD-10-CM | POA: Diagnosis not present

## 2021-05-26 DIAGNOSIS — M25562 Pain in left knee: Secondary | ICD-10-CM | POA: Diagnosis not present

## 2021-05-28 DIAGNOSIS — F331 Major depressive disorder, recurrent, moderate: Secondary | ICD-10-CM | POA: Diagnosis not present

## 2021-05-29 DIAGNOSIS — M25662 Stiffness of left knee, not elsewhere classified: Secondary | ICD-10-CM | POA: Diagnosis not present

## 2021-05-29 DIAGNOSIS — M25562 Pain in left knee: Secondary | ICD-10-CM | POA: Diagnosis not present

## 2021-05-29 DIAGNOSIS — R600 Localized edema: Secondary | ICD-10-CM | POA: Diagnosis not present

## 2021-06-02 DIAGNOSIS — Z03818 Encounter for observation for suspected exposure to other biological agents ruled out: Secondary | ICD-10-CM | POA: Diagnosis not present

## 2021-06-02 DIAGNOSIS — J019 Acute sinusitis, unspecified: Secondary | ICD-10-CM | POA: Diagnosis not present

## 2021-06-03 DIAGNOSIS — M25662 Stiffness of left knee, not elsewhere classified: Secondary | ICD-10-CM | POA: Diagnosis not present

## 2021-06-03 DIAGNOSIS — M25562 Pain in left knee: Secondary | ICD-10-CM | POA: Diagnosis not present

## 2021-06-04 DIAGNOSIS — F331 Major depressive disorder, recurrent, moderate: Secondary | ICD-10-CM | POA: Diagnosis not present

## 2021-06-10 DIAGNOSIS — M25562 Pain in left knee: Secondary | ICD-10-CM | POA: Diagnosis not present

## 2021-06-10 DIAGNOSIS — M25662 Stiffness of left knee, not elsewhere classified: Secondary | ICD-10-CM | POA: Diagnosis not present

## 2021-06-11 DIAGNOSIS — F331 Major depressive disorder, recurrent, moderate: Secondary | ICD-10-CM | POA: Diagnosis not present

## 2021-06-12 DIAGNOSIS — M25562 Pain in left knee: Secondary | ICD-10-CM | POA: Diagnosis not present

## 2021-06-12 DIAGNOSIS — M25662 Stiffness of left knee, not elsewhere classified: Secondary | ICD-10-CM | POA: Diagnosis not present

## 2021-06-16 DIAGNOSIS — M25662 Stiffness of left knee, not elsewhere classified: Secondary | ICD-10-CM | POA: Diagnosis not present

## 2021-06-16 DIAGNOSIS — M25562 Pain in left knee: Secondary | ICD-10-CM | POA: Diagnosis not present

## 2021-06-18 DIAGNOSIS — F331 Major depressive disorder, recurrent, moderate: Secondary | ICD-10-CM | POA: Diagnosis not present

## 2021-06-19 DIAGNOSIS — M25662 Stiffness of left knee, not elsewhere classified: Secondary | ICD-10-CM | POA: Diagnosis not present

## 2021-06-19 DIAGNOSIS — M25562 Pain in left knee: Secondary | ICD-10-CM | POA: Diagnosis not present

## 2021-06-24 DIAGNOSIS — M25562 Pain in left knee: Secondary | ICD-10-CM | POA: Diagnosis not present

## 2021-06-24 DIAGNOSIS — M25662 Stiffness of left knee, not elsewhere classified: Secondary | ICD-10-CM | POA: Diagnosis not present

## 2021-06-25 DIAGNOSIS — F331 Major depressive disorder, recurrent, moderate: Secondary | ICD-10-CM | POA: Diagnosis not present

## 2021-06-30 DIAGNOSIS — M25662 Stiffness of left knee, not elsewhere classified: Secondary | ICD-10-CM | POA: Diagnosis not present

## 2021-06-30 DIAGNOSIS — M25562 Pain in left knee: Secondary | ICD-10-CM | POA: Diagnosis not present

## 2021-07-02 DIAGNOSIS — F331 Major depressive disorder, recurrent, moderate: Secondary | ICD-10-CM | POA: Diagnosis not present

## 2021-07-08 ENCOUNTER — Other Ambulatory Visit: Payer: Self-pay | Admitting: Obstetrics & Gynecology

## 2021-07-08 DIAGNOSIS — Z1231 Encounter for screening mammogram for malignant neoplasm of breast: Secondary | ICD-10-CM

## 2021-07-09 DIAGNOSIS — M25562 Pain in left knee: Secondary | ICD-10-CM | POA: Diagnosis not present

## 2021-07-09 DIAGNOSIS — F331 Major depressive disorder, recurrent, moderate: Secondary | ICD-10-CM | POA: Diagnosis not present

## 2021-07-09 DIAGNOSIS — M25662 Stiffness of left knee, not elsewhere classified: Secondary | ICD-10-CM | POA: Diagnosis not present

## 2021-07-14 DIAGNOSIS — M25562 Pain in left knee: Secondary | ICD-10-CM | POA: Diagnosis not present

## 2021-07-14 DIAGNOSIS — M25662 Stiffness of left knee, not elsewhere classified: Secondary | ICD-10-CM | POA: Diagnosis not present

## 2021-07-17 DIAGNOSIS — F331 Major depressive disorder, recurrent, moderate: Secondary | ICD-10-CM | POA: Diagnosis not present

## 2021-07-23 DIAGNOSIS — F331 Major depressive disorder, recurrent, moderate: Secondary | ICD-10-CM | POA: Diagnosis not present

## 2021-07-30 DIAGNOSIS — F331 Major depressive disorder, recurrent, moderate: Secondary | ICD-10-CM | POA: Diagnosis not present

## 2021-08-06 DIAGNOSIS — F331 Major depressive disorder, recurrent, moderate: Secondary | ICD-10-CM | POA: Diagnosis not present

## 2021-08-11 DIAGNOSIS — M25662 Stiffness of left knee, not elsewhere classified: Secondary | ICD-10-CM | POA: Diagnosis not present

## 2021-08-11 DIAGNOSIS — Z0000 Encounter for general adult medical examination without abnormal findings: Secondary | ICD-10-CM | POA: Diagnosis not present

## 2021-08-11 DIAGNOSIS — M25562 Pain in left knee: Secondary | ICD-10-CM | POA: Diagnosis not present

## 2021-08-11 DIAGNOSIS — F419 Anxiety disorder, unspecified: Secondary | ICD-10-CM | POA: Diagnosis not present

## 2021-08-11 DIAGNOSIS — F321 Major depressive disorder, single episode, moderate: Secondary | ICD-10-CM | POA: Diagnosis not present

## 2021-08-11 DIAGNOSIS — Z130 Encounter for screening for diseases of the blood and blood-forming organs and certain disorders involving the immune mechanism: Secondary | ICD-10-CM | POA: Diagnosis not present

## 2021-08-13 DIAGNOSIS — F331 Major depressive disorder, recurrent, moderate: Secondary | ICD-10-CM | POA: Diagnosis not present

## 2021-08-18 ENCOUNTER — Other Ambulatory Visit: Payer: Self-pay

## 2021-08-18 ENCOUNTER — Ambulatory Visit
Admission: RE | Admit: 2021-08-18 | Discharge: 2021-08-18 | Disposition: A | Discharge status: Home / Self Care | Payer: BC Managed Care – PPO | Source: Ambulatory Visit | Attending: Obstetrics & Gynecology | Admitting: Obstetrics & Gynecology

## 2021-08-18 DIAGNOSIS — Z1231 Encounter for screening mammogram for malignant neoplasm of breast: Secondary | ICD-10-CM | POA: Insufficient documentation

## 2021-08-20 DIAGNOSIS — F331 Major depressive disorder, recurrent, moderate: Secondary | ICD-10-CM | POA: Diagnosis not present

## 2021-08-27 DIAGNOSIS — F331 Major depressive disorder, recurrent, moderate: Secondary | ICD-10-CM | POA: Diagnosis not present

## 2021-09-01 NOTE — Telephone Encounter (Signed)
Mirena rcvd 11/21/2018

## 2021-09-10 DIAGNOSIS — F331 Major depressive disorder, recurrent, moderate: Secondary | ICD-10-CM | POA: Diagnosis not present

## 2021-09-17 DIAGNOSIS — F331 Major depressive disorder, recurrent, moderate: Secondary | ICD-10-CM | POA: Diagnosis not present

## 2021-09-23 DIAGNOSIS — F419 Anxiety disorder, unspecified: Secondary | ICD-10-CM | POA: Diagnosis not present

## 2021-09-23 DIAGNOSIS — Z130 Encounter for screening for diseases of the blood and blood-forming organs and certain disorders involving the immune mechanism: Secondary | ICD-10-CM | POA: Diagnosis not present

## 2021-09-23 DIAGNOSIS — Z131 Encounter for screening for diabetes mellitus: Secondary | ICD-10-CM | POA: Diagnosis not present

## 2021-09-23 DIAGNOSIS — F321 Major depressive disorder, single episode, moderate: Secondary | ICD-10-CM | POA: Diagnosis not present

## 2021-09-24 DIAGNOSIS — F331 Major depressive disorder, recurrent, moderate: Secondary | ICD-10-CM | POA: Diagnosis not present

## 2022-08-13 ENCOUNTER — Other Ambulatory Visit: Payer: Self-pay | Admitting: Family Medicine

## 2022-08-13 DIAGNOSIS — Z1231 Encounter for screening mammogram for malignant neoplasm of breast: Secondary | ICD-10-CM

## 2022-08-26 ENCOUNTER — Ambulatory Visit
Admission: RE | Admit: 2022-08-26 | Discharge: 2022-08-26 | Disposition: A | Discharge status: Home / Self Care | Payer: BC Managed Care – PPO | Source: Ambulatory Visit | Attending: Family Medicine | Admitting: Family Medicine

## 2022-08-26 DIAGNOSIS — Z1231 Encounter for screening mammogram for malignant neoplasm of breast: Secondary | ICD-10-CM | POA: Insufficient documentation

## 2022-08-28 ENCOUNTER — Other Ambulatory Visit: Payer: Self-pay | Admitting: Family Medicine

## 2022-08-28 DIAGNOSIS — R928 Other abnormal and inconclusive findings on diagnostic imaging of breast: Secondary | ICD-10-CM

## 2022-08-28 DIAGNOSIS — N6489 Other specified disorders of breast: Secondary | ICD-10-CM

## 2022-09-01 ENCOUNTER — Ambulatory Visit
Admission: RE | Admit: 2022-09-01 | Discharge: 2022-09-01 | Disposition: A | Discharge status: Home / Self Care | Payer: BC Managed Care – PPO | Source: Ambulatory Visit | Attending: Family Medicine | Admitting: Family Medicine

## 2022-09-01 DIAGNOSIS — N6489 Other specified disorders of breast: Secondary | ICD-10-CM | POA: Insufficient documentation

## 2022-09-01 DIAGNOSIS — R928 Other abnormal and inconclusive findings on diagnostic imaging of breast: Secondary | ICD-10-CM | POA: Diagnosis present

## 2022-09-16 ENCOUNTER — Other Ambulatory Visit: Payer: BC Managed Care – PPO

## 2023-05-03 IMAGING — MG MM DIGITAL SCREENING BILAT W/ TOMO AND CAD
8 series · 9 of 24 positions shown · non-contrast
Comparison: Previous exam(s).

CLINICAL DATA: Screening.

EXAM:
DIGITAL SCREENING BILATERAL MAMMOGRAM WITH TOMOSYNTHESIS AND CAD
TECHNIQUE: Bilateral screening digital craniocaudal and mediolateral oblique
mammograms were obtained. Bilateral screening digital breast
tomosynthesis was performed. The images were evaluated with
computer-aided detection.

[L MLO synth-2D]
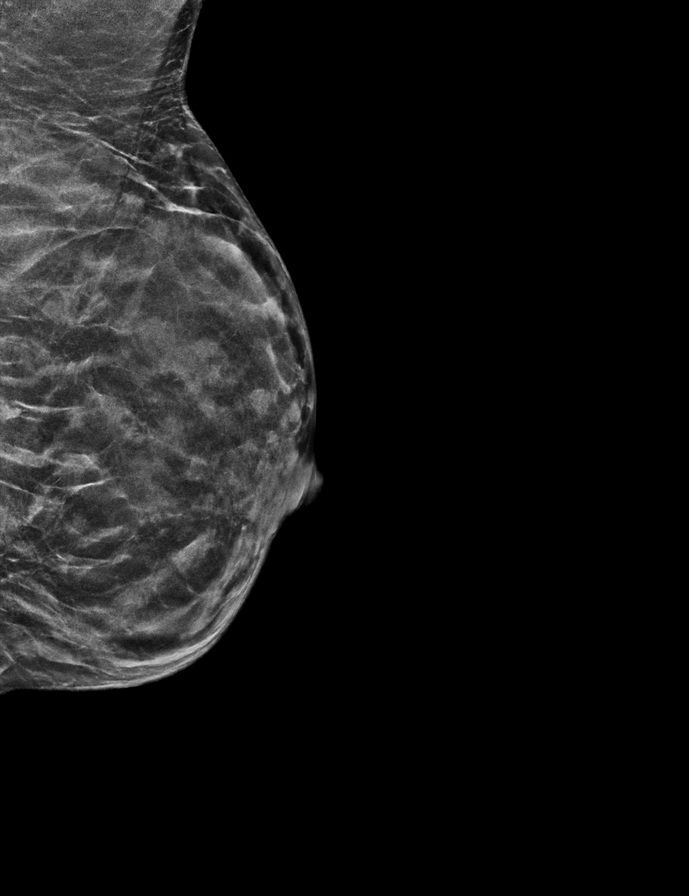

[L CC synth-2D]
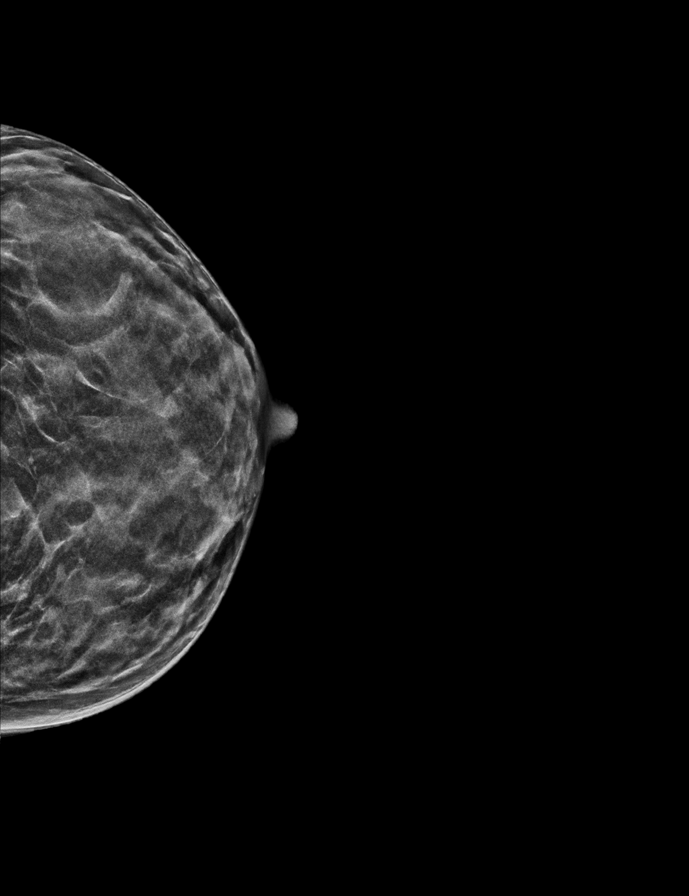

[R MLO synth-2D]
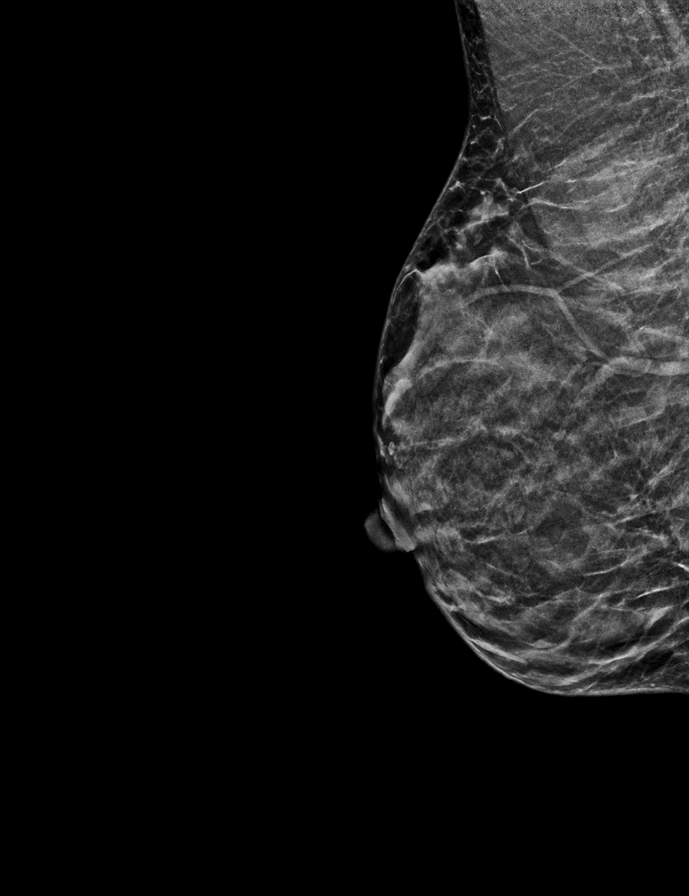

[R CC synth-2D]
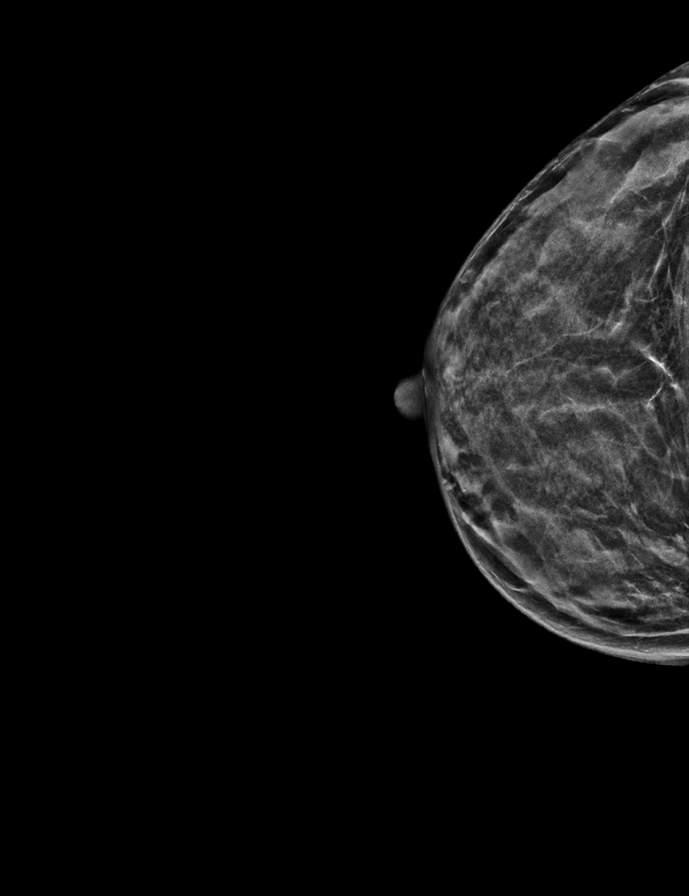

[R CC tomo · 2 of 36 frames shown]
[frame 12/36]
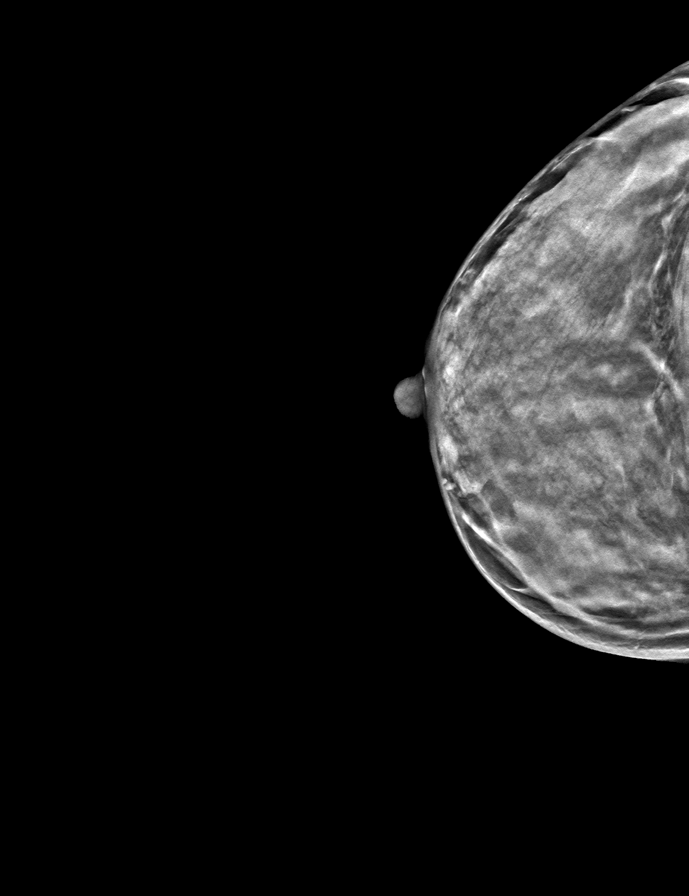
[frame 19/36]
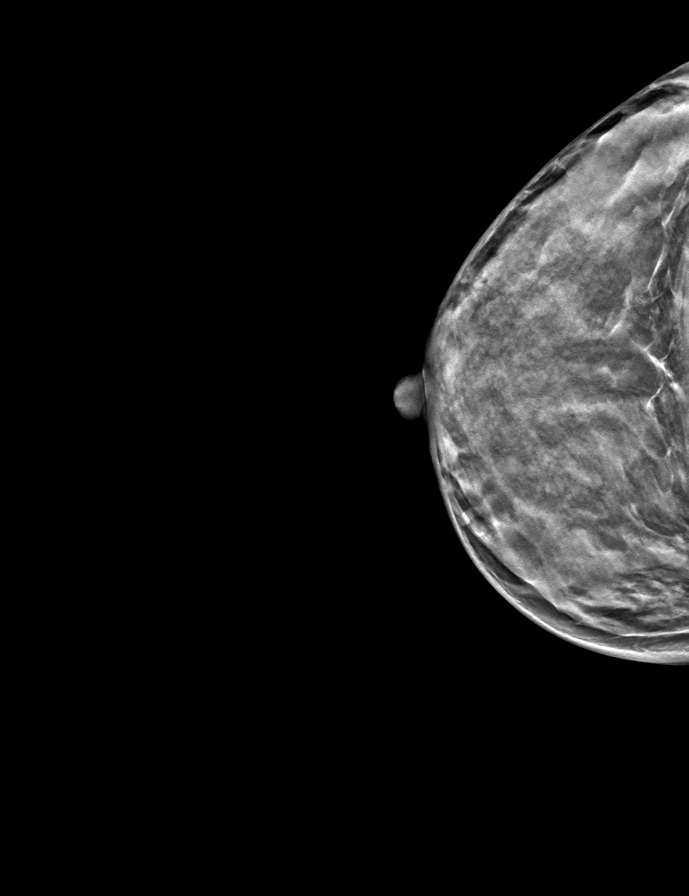

[L MLO tomo · tomo slice 23/45.0]
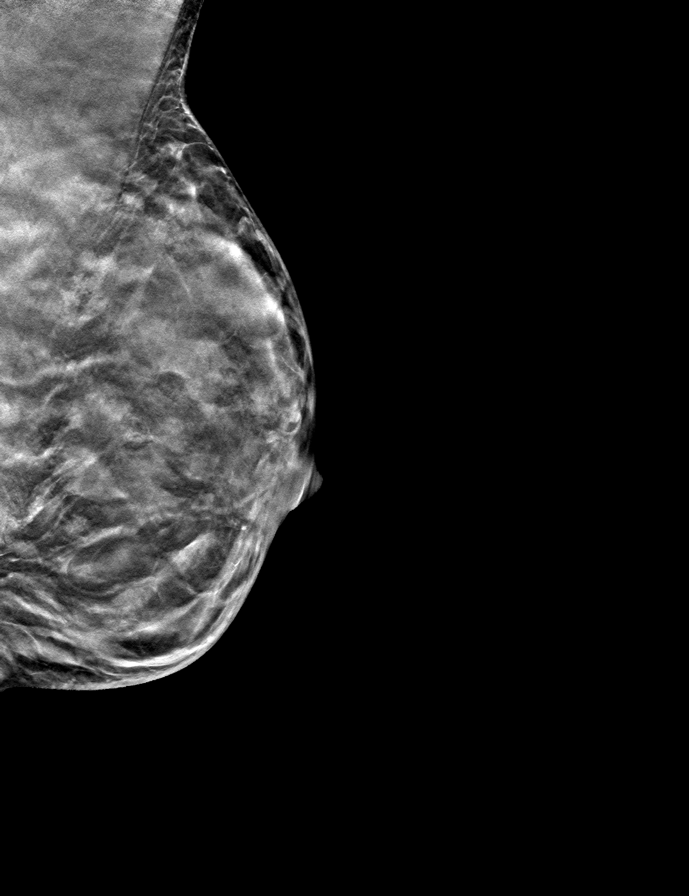

[R MLO tomo · tomo slice 17/34.0]
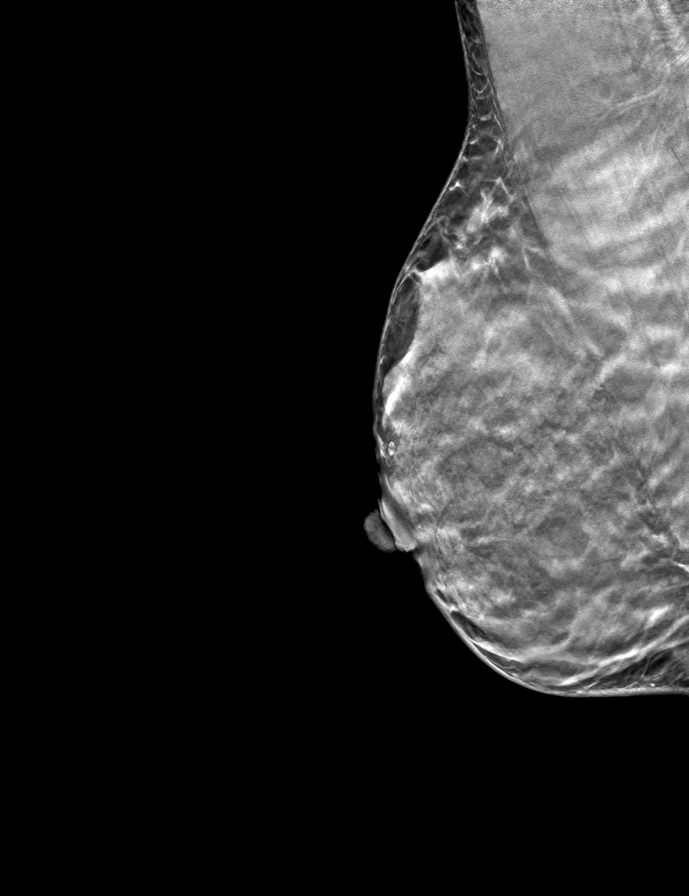

[L CC tomo · tomo slice 21/40.0]
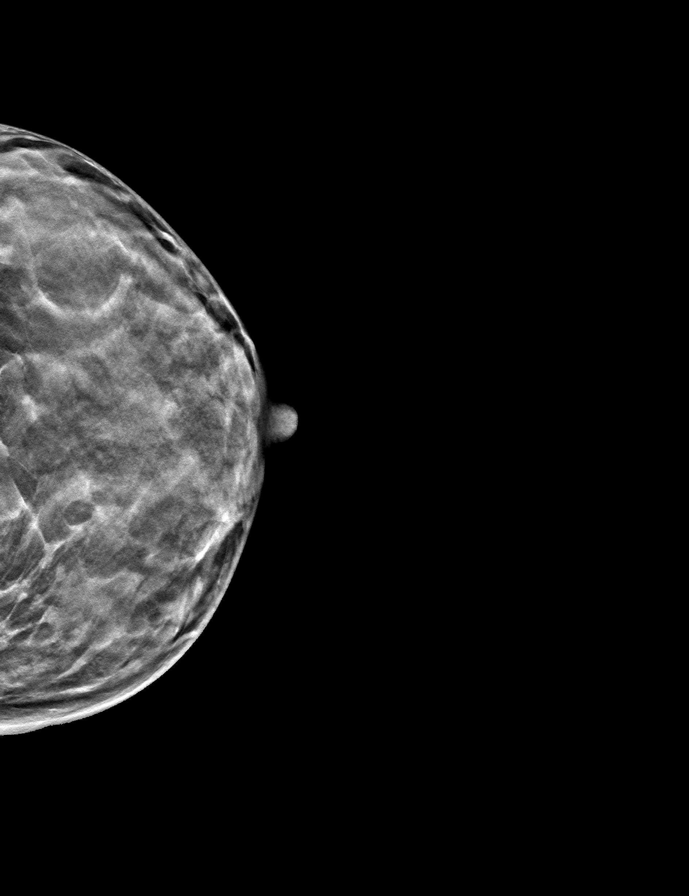

[9 of 24 positions shown; findings below may reference images not displayed]

ACR Breast Density Category d: The breast tissue is extremely dense,
which lowers the sensitivity of mammography
FINDINGS: There are no findings suspicious for malignancy.
IMPRESSION: No mammographic evidence of malignancy. A result letter of this
screening mammogram will be mailed directly to the patient.

RECOMMENDATION:
Screening mammogram in one year. (Code:TA-[id number])

BI-RADS CATEGORY  1: Negative.

## 2023-07-27 ENCOUNTER — Other Ambulatory Visit: Payer: Self-pay | Admitting: Family Medicine

## 2023-07-27 DIAGNOSIS — Z1231 Encounter for screening mammogram for malignant neoplasm of breast: Secondary | ICD-10-CM

## 2023-08-30 ENCOUNTER — Ambulatory Visit
Admission: RE | Admit: 2023-08-30 | Discharge: 2023-08-30 | Disposition: A | Discharge status: Home / Self Care | Payer: BC Managed Care – PPO | Source: Ambulatory Visit | Attending: Family Medicine | Admitting: Family Medicine

## 2023-08-30 DIAGNOSIS — Z1231 Encounter for screening mammogram for malignant neoplasm of breast: Secondary | ICD-10-CM | POA: Insufficient documentation

## 2024-05-08 ENCOUNTER — Other Ambulatory Visit: Payer: Self-pay | Admitting: Medical Genetics

## 2024-05-15 ENCOUNTER — Other Ambulatory Visit: Payer: Self-pay

## 2024-07-18 ENCOUNTER — Other Ambulatory Visit: Payer: Self-pay | Admitting: Obstetrics and Gynecology

## 2024-07-18 DIAGNOSIS — Z1231 Encounter for screening mammogram for malignant neoplasm of breast: Secondary | ICD-10-CM

## 2024-08-31 ENCOUNTER — Ambulatory Visit
Admission: RE | Admit: 2024-08-31 | Discharge: 2024-08-31 | Disposition: A | Source: Ambulatory Visit | Attending: Obstetrics and Gynecology | Admitting: Obstetrics and Gynecology

## 2024-08-31 DIAGNOSIS — Z1231 Encounter for screening mammogram for malignant neoplasm of breast: Secondary | ICD-10-CM | POA: Diagnosis present

## 2024-09-08 ENCOUNTER — Other Ambulatory Visit: Payer: Self-pay | Admitting: Obstetrics and Gynecology

## 2024-09-08 DIAGNOSIS — R928 Other abnormal and inconclusive findings on diagnostic imaging of breast: Secondary | ICD-10-CM

## 2024-09-18 ENCOUNTER — Inpatient Hospital Stay
Admission: RE | Admit: 2024-09-18 | Discharge: 2024-09-18 | Attending: Obstetrics and Gynecology | Admitting: Obstetrics and Gynecology

## 2024-09-18 ENCOUNTER — Ambulatory Visit
Admission: RE | Admit: 2024-09-18 | Discharge: 2024-09-18 | Disposition: A | Discharge status: Home / Self Care | Source: Ambulatory Visit | Attending: Obstetrics and Gynecology | Admitting: Obstetrics and Gynecology

## 2024-09-18 DIAGNOSIS — R928 Other abnormal and inconclusive findings on diagnostic imaging of breast: Secondary | ICD-10-CM | POA: Diagnosis present
